# Patient Record
Sex: Male | Born: 1990 | Race: Black or African American | Hispanic: No | Marital: Single | Smoking: Current every day smoker
Health system: Southern US, Community
[De-identification: ages and names within clinical notes are randomized; demographics above are authoritative.]

---

## 2011-10-13 ENCOUNTER — Encounter (HOSPITAL_COMMUNITY): Payer: Self-pay | Admitting: Emergency Medicine

## 2011-10-13 ENCOUNTER — Emergency Department (HOSPITAL_COMMUNITY)
Admission: EM | Admit: 2011-10-13 | Discharge: 2011-10-13 | Disposition: A | Discharge status: Home / Self Care | Payer: Self-pay | Attending: Emergency Medicine | Admitting: Emergency Medicine

## 2011-10-13 DIAGNOSIS — F17200 Nicotine dependence, unspecified, uncomplicated: Secondary | ICD-10-CM | POA: Insufficient documentation

## 2011-10-13 DIAGNOSIS — H6690 Otitis media, unspecified, unspecified ear: Secondary | ICD-10-CM | POA: Insufficient documentation

## 2011-10-13 MED ORDER — AMOXICILLIN 500 MG PO CAPS: 500.0000 mg | ORAL_CAPSULE | Freq: Three times a day (TID) | ORAL | Status: AC

## 2011-10-13 MED ORDER — ANTIPYRINE-BENZOCAINE 5.4-1.4 % OT SOLN
3.0000 [drp] | Freq: Once | OTIC | Status: AC
  Administered 2011-10-13: 3 [drp] via OTIC
  Filled 2011-10-13: qty 10

## 2011-10-13 MED ORDER — NAPROXEN 500 MG PO TABS: 500.0000 mg | ORAL_TABLET | Freq: Two times a day (BID) | ORAL | Status: DC

## 2011-10-13 NOTE — Discharge Instructions (Signed)
Otitis Media, Adult  A middle ear infection is an infection in the space behind the eardrum. The medical name for this is "otitis media." It may happen after a common cold. It is caused by a germ that starts growing in that space. You may feel swollen glands in your neck on the side of the ear infection.  HOME CARE INSTRUCTIONS   · Take your medicine as directed until it is gone, even if you feel better after the first few days.  · Only take over-the-counter or prescription medicines for pain, discomfort, or fever as directed by your caregiver.  · Occasional use of a nasal decongestant a couple times per day may help with discomfort and help the eustachian tube to drain better.  Follow up with your caregiver in 10 to 14 days or as directed, to be certain that the infection has cleared. Not keeping the appointment could result in a chronic or permanent injury, pain, hearing loss and disability. If there is any problem keeping the appointment, you must call back to this facility for assistance.  SEEK IMMEDIATE MEDICAL CARE IF:   · You are not getting better in 2 to 3 days.  · You have pain that is not controlled with medication.  · You feel worse instead of better.  · You cannot use the medication as directed.  · You develop swelling, redness or pain around the ear or stiffness in your neck.  MAKE SURE YOU:   · Understand these instructions.  · Will watch your condition.  · Will get help right away if you are not doing well or get worse.  Document Released: 01/30/2004 Document Revised: 04/15/2011 Document Reviewed: 12/01/2007  [employer]® Patient Information ©2012 [employer].

## 2011-10-13 NOTE — ED Notes (Signed)
Pt c/o left sided earache x 3 days

## 2011-10-13 NOTE — ED Provider Notes (Signed)
History   This chart was scribed for Celene Kras, MD by Melba Coon. The patient was seen in room STRE4/STRE4 and the patient's care was started at 1:24PM.    CSN: [id number]  Arrival date & time 10/13/11  1309   First MD Initiated Contact with Patient 10/13/11 1320      Chief Complaint  Patient presents with  . Otalgia    (Consider location/radiation/quality/duration/timing/severity/associated sxs/prior treatment) HPI Jesse Cline is a 21 y.o. male who presents to the Emergency Department complaining of constant, moderate to severe left otalgia with an onset 3 days ago. Pt thinks it is an ear infection or sinus infection. Pain has been getting progressively worse. Nothing alleviates or aggravates the pain. Rhinorrhea present. No HA, fever, neck pain, sore throat, rash, back pain, CP, SOB, abd pain, n/v/d, dysuria, or extremity pain, edema, weakness, numbness, or tingling. No known allergies. No other pertinent medical symptoms.   History reviewed. No pertinent past medical history.  History reviewed. No pertinent past surgical history.  History reviewed. No pertinent family history.  History  Substance Use Topics  . Smoking status: Current Everyday Smoker  . Smokeless tobacco: Not on file  . Alcohol Use: Yes      Review of Systems 10 Systems reviewed and all are negative for acute change except as noted in the HPI.   Allergies  Review of patient's allergies indicates no known allergies.  Home Medications  No current outpatient prescriptions on file.  BP 110/67  Pulse 74  Temp(Src) 98.4 F (36.9 C) (Oral)  Resp 18  SpO2 100%  Physical Exam  Nursing note and vitals reviewed. Constitutional: He appears well-developed and well-nourished. No distress.  HENT:  Head: Normocephalic and atraumatic.  Right Ear: External ear normal.  Left Ear: External ear normal.       Erythematous infusion of left TM; mild tonsillar hypertrophy  Eyes: Conjunctivae are  normal. Right eye exhibits no discharge. Left eye exhibits no discharge. No scleral icterus.  Neck: Neck supple. No tracheal deviation present.  Cardiovascular: Normal rate, regular rhythm and intact distal pulses.   Pulmonary/Chest: Effort normal and breath sounds normal. No stridor. No respiratory distress. He has no wheezes. He has no rales.  Abdominal: Soft. Bowel sounds are normal. He exhibits no distension. There is no tenderness. There is no rebound and no guarding.  Musculoskeletal: He exhibits no edema and no tenderness.  Neurological: He is alert. He has normal strength. No sensory deficit. Cranial nerve deficit:  no gross defecits noted. He exhibits normal muscle tone. He displays no seizure activity. Coordination normal.  Skin: Skin is warm and dry. No rash noted.  Psychiatric: He has a normal mood and affect.    ED Course  Procedures (including critical care time)  DIAGNOSTIC STUDIES: Oxygen Saturation is 100% on room air, normal by my interpretation.    COORDINATION OF CARE:  1:28PM - Rx of amoxicillin and pain meds will be ordered for the pt. Pt ready for d/c.   Labs Reviewed - No data to display No results found.   1. Otitis media       MDM  Patient appears to have an otitis media. There does not appear to be any complicating factors such as mastoiditis.  I personally performed the services described in this documentation, which was scribed in my presence.  The recorded information has been reviewed and considered.        Celene Kras, MD 10/13/11 [phone]

## 2012-03-25 ENCOUNTER — Emergency Department (HOSPITAL_COMMUNITY)
Admission: EM | Admit: 2012-03-25 | Discharge: 2012-03-25 | Disposition: A | Discharge status: Home / Self Care | Payer: BC Managed Care – PPO | Attending: Emergency Medicine | Admitting: Emergency Medicine

## 2012-03-25 ENCOUNTER — Encounter (HOSPITAL_COMMUNITY): Payer: Self-pay | Admitting: *Deleted

## 2012-03-25 DIAGNOSIS — H6690 Otitis media, unspecified, unspecified ear: Secondary | ICD-10-CM | POA: Insufficient documentation

## 2012-03-25 DIAGNOSIS — R51 Headache: Secondary | ICD-10-CM | POA: Insufficient documentation

## 2012-03-25 DIAGNOSIS — F17200 Nicotine dependence, unspecified, uncomplicated: Secondary | ICD-10-CM | POA: Insufficient documentation

## 2012-03-25 DIAGNOSIS — J029 Acute pharyngitis, unspecified: Secondary | ICD-10-CM | POA: Insufficient documentation

## 2012-03-25 DIAGNOSIS — R42 Dizziness and giddiness: Secondary | ICD-10-CM | POA: Insufficient documentation

## 2012-03-25 MED ORDER — AMOXICILLIN 500 MG PO CAPS: 500.0000 mg | ORAL_CAPSULE | Freq: Three times a day (TID) | ORAL | Status: DC

## 2012-03-25 MED ORDER — OXYCODONE-ACETAMINOPHEN 5-325 MG PO TABS
1.0000 | ORAL_TABLET | Freq: Once | ORAL | Status: AC
  Administered 2012-03-25: 1 via ORAL
  Filled 2012-03-25: qty 1

## 2012-03-25 NOTE — ED Notes (Signed)
Pt alert, NAD, calm, interactive, skin W&D, resps e/u, speaking in clear complete sentences. C/o R earache. Also mentions HA, dizziness and sore throat. Onset friday night. Took ibuprofen around 2230. (denies: fever, cough, congestion, cold sx, nvd or other sx). Ambulatory with steady gait. LS CTA, throat red, swollen & irritated w/o exudate.

## 2012-03-25 NOTE — ED Notes (Signed)
Calling for ride, out to d/c desk, given Rx x1 & referral. Pain med given at d/c.

## 2012-03-25 NOTE — ED Notes (Signed)
EDP at [facility] 

## 2012-03-25 NOTE — ED Provider Notes (Signed)
History     CSN: [id number]  Arrival date & time 03/25/12  8469   First MD Initiated Contact with Patient 03/25/12 [phone]      Chief Complaint  Patient presents with  . Otalgia  . Sore Throat  . Dizziness  . Headache     Patient is a 21 y.o. male presenting with ear pain. The history is provided by the patient.  Otalgia This is a new problem. The current episode started yesterday. There is pain in the right ear. The problem occurs constantly. The problem has been gradually worsening. There has been no fever. Pertinent negatives include no ear discharge. His past medical history does not include hearing loss.  pt reports ear pain but no discharge or trauma to ear No hearing loss but he does report tinnitus  PMH - none  History reviewed. No pertinent past surgical history.  History reviewed. No pertinent family history.  History  Substance Use Topics  . Smoking status: Current Every Day Smoker  . Smokeless tobacco: Not on file  . Alcohol Use: Yes      Review of Systems  Constitutional: Negative for fever.  HENT: Positive for ear pain. Negative for ear discharge.     Allergies  Review of patient's allergies indicates no known allergies.  Home Medications   Current Outpatient Rx  Name  Route  Sig  Dispense  Refill  . AMOXICILLIN 500 MG PO CAPS   Oral   Take 1 capsule (500 mg total) by mouth 3 (three) times daily.   21 capsule   0   . NAPROXEN 500 MG PO TABS   Oral   Take 1 tablet (500 mg total) by mouth 2 (two) times daily with a meal. As needed for pain   20 tablet   0     BP 110/69  Pulse 69  Temp 98.4 F (36.9 C) (Oral)  Resp 14  SpO2 100%  Physical Exam CONSTITUTIONAL: Well developed/well nourished HEAD AND FACE: Normocephalic/atraumatic EYES: EOMI/PERRL ENMT: Mucous membranes moist -right TM erythematous, loss of landmarks noted and he has exquisite tenderness with palpation of ear.  ?small TM perforation.  Left TM normal in appearance NECK:  supple no meningeal signs LUNGS: Lungs are clear to auscultation bilaterally, no apparent distress ABDOMEN: soft, nontender, no rebound or guarding NEURO: Pt is awake/alert, moves all extremitiesx4 EXTREMITIES: pulses normal, full ROM SKIN: warm, color normal PSYCH: no abnormalities of mood noted  ED Course  Procedures   1. Otitis media       MDM  Nursing notes including past medical history and social history reviewed and considered in documentation  Will start amox and if no improvement, he was referred to ENT        Joya Gaskins, MD 03/25/12 [phone]

## 2013-01-30 ENCOUNTER — Encounter (HOSPITAL_COMMUNITY): Payer: Self-pay | Admitting: Emergency Medicine

## 2013-01-30 ENCOUNTER — Emergency Department (HOSPITAL_COMMUNITY)
Admission: EM | Admit: 2013-01-30 | Discharge: 2013-01-30 | Disposition: A | Discharge status: Home / Self Care | Payer: BC Managed Care – PPO | Attending: Emergency Medicine | Admitting: Emergency Medicine

## 2013-01-30 DIAGNOSIS — F17200 Nicotine dependence, unspecified, uncomplicated: Secondary | ICD-10-CM | POA: Insufficient documentation

## 2013-01-30 DIAGNOSIS — Z79899 Other long term (current) drug therapy: Secondary | ICD-10-CM | POA: Insufficient documentation

## 2013-01-30 DIAGNOSIS — J029 Acute pharyngitis, unspecified: Secondary | ICD-10-CM | POA: Insufficient documentation

## 2013-01-30 LAB — RAPID STREP SCREEN (MED CTR MEBANE ONLY): Streptococcus, Group A Screen (Direct): NEGATIVE

## 2013-01-30 MED ORDER — PENICILLIN V POTASSIUM 500 MG PO TABS: 500.0000 mg | ORAL_TABLET | Freq: Three times a day (TID) | ORAL | Status: AC

## 2013-01-30 NOTE — ED Notes (Signed)
Pt c/o sore throat, headache, and fever x2 days.

## 2013-01-30 NOTE — ED Notes (Signed)
Pt c/o sore throat with fever x 2 days

## 2013-01-30 NOTE — ED Provider Notes (Signed)
CSN: [id number]     Arrival date & time 01/30/13  1510 History  This chart was scribed for [employer] with Juliet Rude. Rubin Payor, MD, by Allene Dillon, ED Scribe. This patient was seen in room TR06C/TR06C and the patient's care was started at 4:00 PM.      Chief Complaint  Patient presents with  . Sore Throat   Patient is a 22 y.o. male presenting with pharyngitis. The history is provided by the patient. No language interpreter was used.  Sore Throat This is a new problem. The current episode started 2 days ago. The problem occurs constantly. The problem has not changed since onset.Associated symptoms include headaches. Pertinent negatives include no abdominal pain. Nothing aggravates the symptoms. Nothing relieves the symptoms. He has tried nothing for the symptoms. The treatment provided no relief.   HPI Comments: Jesse Cline is a 22 y.o. male who presents to the Emergency Department complaining of gradual onset, constant sore throat which began two days ago. Pt reports he has a fever of 100, cough, chills, headache. Pt denies nausea, emesis, rhinorrhea, abdominal pain, ear pain or any other symptoms. Pt has no PCP.  Pt denies any allergies.   History reviewed. No pertinent past medical history. History reviewed. No pertinent past surgical history. History reviewed. No pertinent family history. History  Substance Use Topics  . Smoking status: Current Every Day Smoker  . Smokeless tobacco: Not on file  . Alcohol Use: Yes    Review of Systems  Constitutional: Positive for fever and chills.  HENT: Positive for sore throat. Negative for ear pain and rhinorrhea.   Respiratory: Positive for cough.   Gastrointestinal: Negative for nausea, vomiting and abdominal pain.  Neurological: Positive for headaches.  All other systems reviewed and are negative.    Allergies  Review of patient's allergies indicates no known allergies.  Home Medications   Current Outpatient  Rx  Name  Route  Sig  Dispense  Refill  . Pseudoeph-Doxylamine-DM-APAP (NYQUIL PO)   Oral   Take 30 mLs by mouth daily as needed (for cough).          Triage Vitals:BP 110/61  Pulse 98  Temp(Src) 100 F (37.8 C) (Oral)  Resp 18  SpO2 98%  Physical Exam  Nursing note and vitals reviewed. Constitutional: He is oriented to person, place, and time. He appears well-developed and well-nourished. No distress.  HENT:  Head: Normocephalic and atraumatic.  Exudate on right tonsil. Mild oropharyngeal Erythema.   Eyes: EOM are normal.  Neck: Normal range of motion.  Cardiovascular: Normal rate, regular rhythm and normal heart sounds.   Pulmonary/Chest: Effort normal and breath sounds normal. No respiratory distress.  Abdominal: Soft. He exhibits no distension.  Musculoskeletal: Normal range of motion.  Lymphadenopathy:    He has cervical adenopathy.  Neurological: He is alert and oriented to person, place, and time.  Skin: Skin is warm and dry.  Psychiatric: He has a normal mood and affect.    ED Course  Procedures (including critical care time)  DIAGNOSTIC STUDIES: Oxygen Saturation is 98% on RA, normal by my interpretation.    COORDINATION OF CARE: 4:05 PM- Will provide lab work, if tested positive for strep will provide antibiotics. Pt advised of plan for treatment and pt agrees.  Labs Review Labs Reviewed  RAPID STREP SCREEN  CULTURE, GROUP A STREP   Imaging Review No results found. Rapid strep screen negative.  Will follow-up with routine culture.  Script provided for pen-vk in case  routine culture is positive--patient will not fill unless notified. MDM  Pharyngitis.  I personally performed the services described in this documentation, which was scribed in my presence. The recorded information has been reviewed and is accurate.    Jimmye Norman, NP 01/30/13 [phone]

## 2013-01-31 NOTE — ED Provider Notes (Signed)
Medical screening examination/treatment/procedure(s) were performed by non-physician practitioner and as supervising physician I was immediately available for consultation/collaboration.  Juliet Rude. Rubin Payor, MD 01/31/13 0020

## 2013-02-01 LAB — CULTURE, GROUP A STREP

## 2014-09-01 ENCOUNTER — Emergency Department
Admit: 2014-09-01 | Disposition: A | Discharge status: Home / Self Care | Payer: Self-pay | Admitting: Emergency Medicine

## 2016-12-22 IMAGING — CR RIGHT RING FINGER 2+V
1 series · 3 of 3 positions shown · non-contrast
Comparison: New

CLINICAL DATA: 24-year-old male with a history of laceration.

EXAM:
RIGHT RING FINGER 2+V

[Series 1: dxr finger ring 4th digit rt han · 0.14mm/px · 3 of 3 slices shown]
[im 1/3]
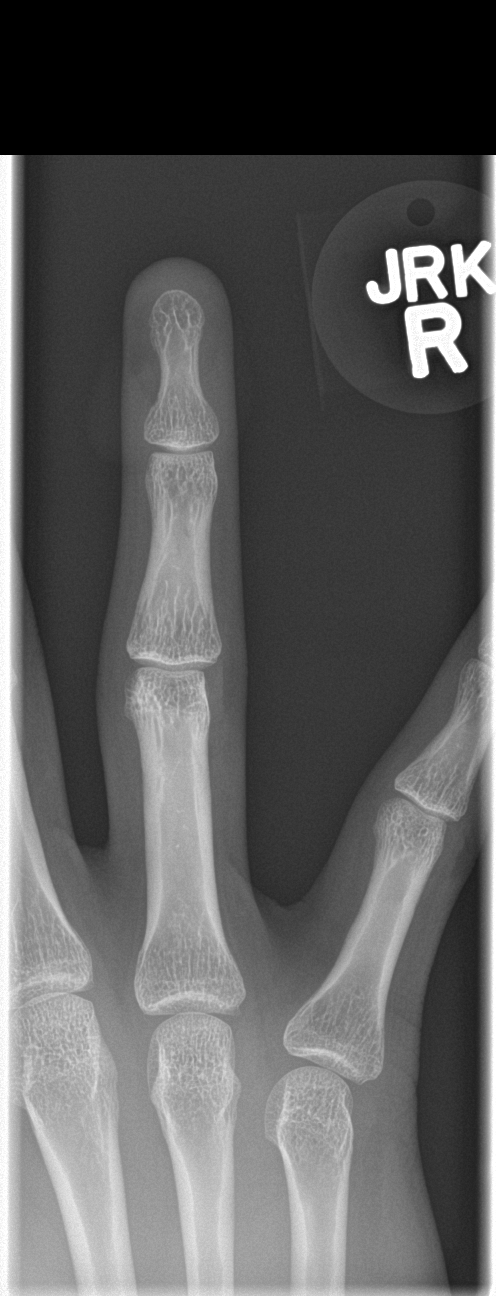
[im 2/3]
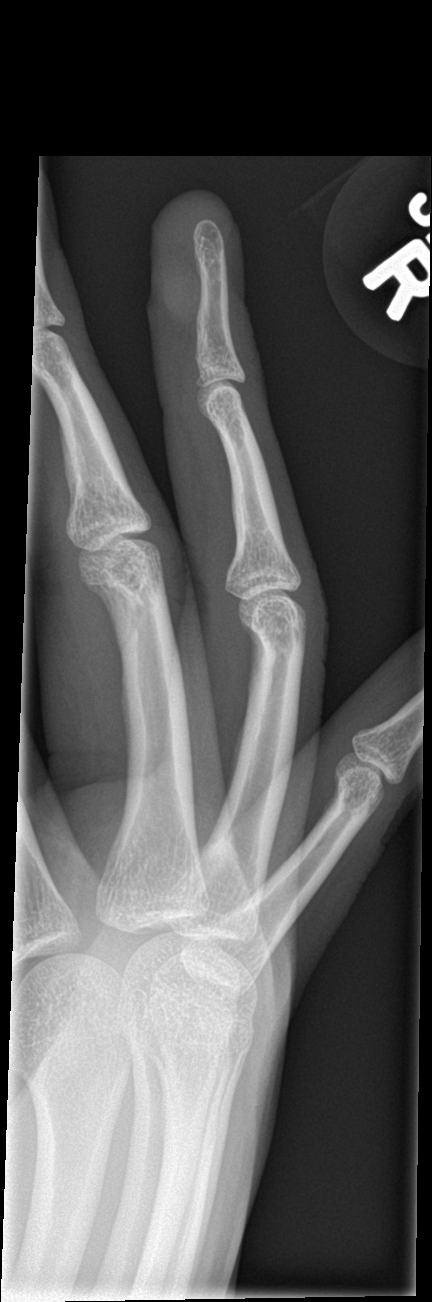
[im 3/3]
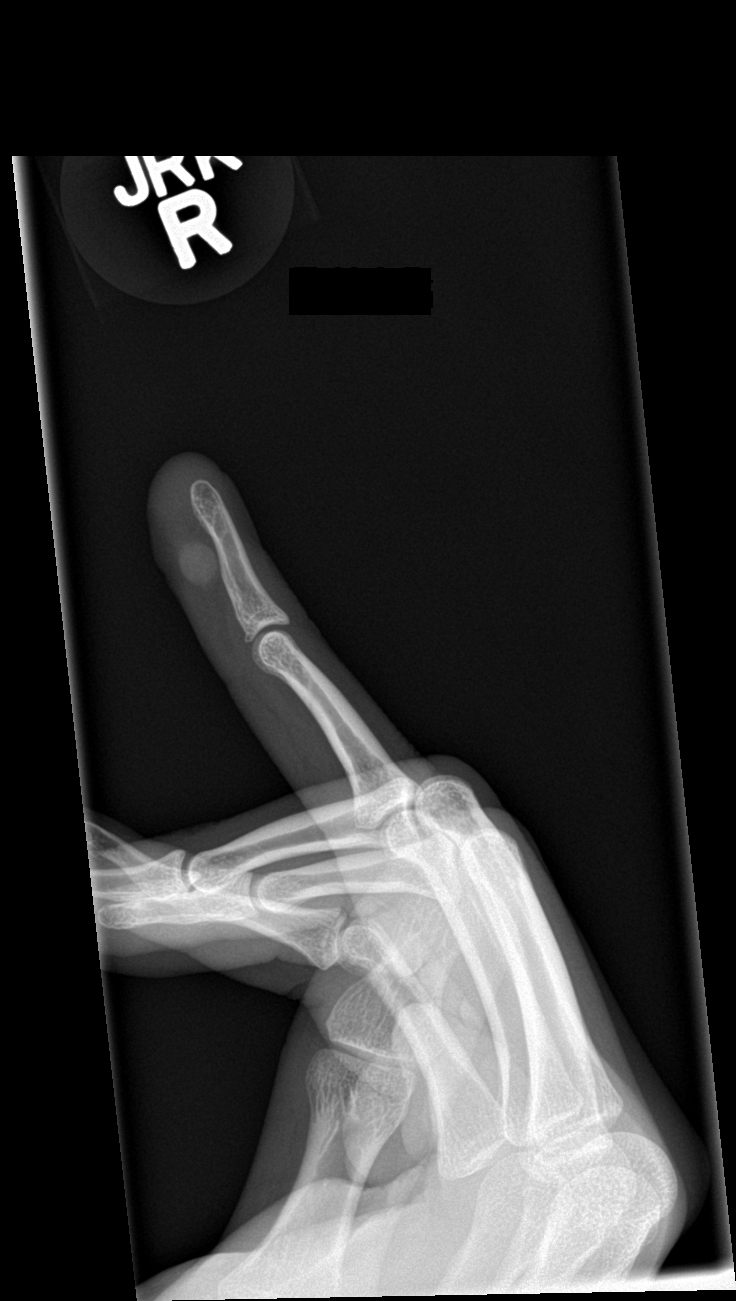

[3 of 3 positions shown; findings below may reference images not displayed]

FINDINGS: No displaced fracture. Irregular appearance of the cortex of the
proximal aspect distal phalanx of the second finger. There is
overlying soft tissue injury compatible with history of laceration.
No other bony abnormality identified. No radiopaque foreign body.
IMPRESSION: No displaced fracture, however, in the region of the patient's soft
tissue injury there is underlying irregularity of the cortex of the
distal phalanx, potentially representing bony involvement.

## 2021-10-18 ENCOUNTER — Encounter (HOSPITAL_BASED_OUTPATIENT_CLINIC_OR_DEPARTMENT_OTHER): Payer: Self-pay | Admitting: Obstetrics and Gynecology

## 2021-10-18 ENCOUNTER — Emergency Department (HOSPITAL_BASED_OUTPATIENT_CLINIC_OR_DEPARTMENT_OTHER)
Admission: EM | Admit: 2021-10-18 | Discharge: 2021-10-18 | Disposition: A | Discharge status: Home / Self Care | Payer: Self-pay | Attending: Emergency Medicine | Admitting: Emergency Medicine

## 2021-10-18 ENCOUNTER — Other Ambulatory Visit: Payer: Self-pay

## 2021-10-18 DIAGNOSIS — J020 Streptococcal pharyngitis: Secondary | ICD-10-CM | POA: Insufficient documentation

## 2021-10-18 LAB — GROUP A STREP BY PCR: Group A Strep by PCR: DETECTED — AB

## 2021-10-18 MED ORDER — AMOXICILLIN 500 MG PO CAPS: 1000.0000 mg | ORAL_CAPSULE | Freq: Every day | ORAL | 0 refills | Status: AC

## 2021-10-18 MED ORDER — LIDOCAINE VISCOUS HCL 2 % MT SOLN
15.0000 mL | Freq: Once | OROMUCOSAL | Status: AC
  Administered 2021-10-18: 15 mL via OROMUCOSAL
  Filled 2021-10-18: qty 15

## 2021-10-18 NOTE — Discharge Instructions (Signed)
Taking antibiotic as prescribed for strep pharyngitis.  For pain take Tylenol and Motrin.  Come back to ER if you develop difficulty swallowing, difficulty breathing or other new concerning symptom.

## 2021-10-18 NOTE — ED Triage Notes (Signed)
Patient reports to the ER for sore and swollen throat. Patient denies other symptoms. Endorses pain with swallowing. Denies fevers.

## 2021-10-20 NOTE — ED Provider Notes (Signed)
[facility] EMERGENCY DEPT Provider Note   CSN: [id number] Arrival date & time: 10/18/21  1348     History  Chief Complaint  Patient presents with   Sore Throat    Jesse Cline is a 31 y.o. male.  Presents emerged Jesse Cline due to concern for sore throat.  Patient reports symptoms ongoing for the past couple days, worsening today.  Pain with swallowing but is able to swallow liquids and solids.  No voice change.  No fevers.  No difficulty in breathing.  No cough.  He denies any major medical problems.  HPI     Home Medications Prior to Admission medications   Medication Sig Start Date End Date Taking? Authorizing Provider  amoxicillin (AMOXIL) 500 MG capsule Take 2 capsules (1,000 mg total) by mouth daily for 10 days. 10/18/21 10/28/21 Yes Anola Mcgough, Quitman Livings, MD  penicillin v potassium (VEETID) 500 MG tablet Take 1 tablet (500 mg total) by mouth 3 (three) times daily. 01/30/13   Felicie Morn, NP  Pseudoeph-Doxylamine-DM-APAP (NYQUIL PO) Take 30 mLs by mouth daily as needed (for cough).    [provider]      Allergies    Patient has no known allergies.    Review of Systems   Review of Systems  Constitutional:  Negative for chills and fever.  HENT:  Positive for sore throat. Negative for ear pain.   Eyes:  Negative for pain and visual disturbance.  Respiratory:  Negative for cough and shortness of breath.   Cardiovascular:  Negative for chest pain and palpitations.  Gastrointestinal:  Negative for abdominal pain and vomiting.  Genitourinary:  Negative for dysuria and hematuria.  Musculoskeletal:  Negative for arthralgias and back pain.  Skin:  Negative for color change and rash.  Neurological:  Negative for seizures and syncope.  All other systems reviewed and are negative.   Physical Exam Updated Vital Signs BP 116/71 (BP Location: Right Arm)   Pulse 80   Temp 98.7 F (37.1 C)   Resp 16   SpO2 97%  Physical Exam Vitals and nursing note  reviewed.  Constitutional:      General: He is not in acute distress.    Appearance: He is well-developed.  HENT:     Head: Normocephalic and atraumatic.     Mouth/Throat:     Comments: There is some erythema in the posterior oropharynx and mild tonsillar exudates bilaterally, airway patent Eyes:     Conjunctiva/sclera: Conjunctivae normal.  Cardiovascular:     Rate and Rhythm: Normal rate and regular rhythm.     Heart sounds: No murmur heard. Pulmonary:     Effort: Pulmonary effort is normal. No respiratory distress.     Breath sounds: Normal breath sounds.  Abdominal:     Palpations: Abdomen is soft.     Tenderness: There is no abdominal tenderness.  Musculoskeletal:        General: No swelling.     Cervical back: Neck supple.  Skin:    General: Skin is warm and dry.     Capillary Refill: Capillary refill takes less than 2 seconds.  Neurological:     Mental Status: He is alert.  Psychiatric:        Mood and Affect: Mood normal.     ED Results / Procedures / Treatments   Labs (all labs ordered are listed, but only abnormal results are displayed) Labs Reviewed  GROUP A STREP BY PCR - Abnormal; Notable for the following components:  Result Value   Group A Strep by PCR DETECTED (*)    All other components within normal limits    EKG None  Radiology No results found.  Procedures Procedures    Medications Ordered in ED Medications  lidocaine (XYLOCAINE) 2 % viscous mouth solution 15 mL (15 mLs Mouth/Throat Given 10/18/21 1534)    ED Course/ Medical Decision Making/ A&P                           Medical Decision Making Risk Prescription drug management.   31 year old male presenting for sore throat.  He has erythematous posterior oropharynx with mild tonsillar exudates.  His strep test is positive.  Presentation consistent with strep pharyngitis.  Gave some symptomatic treatment with viscous lidocaine.  Tolerating p.o. without difficulty, airway patent,  no voice change or muffled voice, neck ROM normal.  Well-appearing.  Appropriate for trial of oral antibiotics.  Gave course of amoxicillin.  Reviewed return precautions and discharge.  After the discussed management above, the patient was determined to be safe for discharge.  The patient was in agreement with this plan and all questions regarding their care were answered.  ED return precautions were discussed and the patient will return to the ED with any significant worsening of condition.         Final Clinical Impression(s) / ED Diagnoses Final diagnoses:  Strep pharyngitis    Rx / DC Orders ED Discharge Orders          Ordered    amoxicillin (AMOXIL) 500 MG capsule  Daily        10/18/21 1514              Milagros Loll, MD 10/20/21 1506
